# Patient Record
Sex: Male | Born: 1995 | Hispanic: Yes | Marital: Single
Health system: Southern US, Community
[De-identification: ages and names within clinical notes are randomized; demographics above are authoritative.]

## PROBLEM LIST (undated history)

## (undated) DIAGNOSIS — J45909 Unspecified asthma, uncomplicated: Secondary | ICD-10-CM

---

## 2017-10-11 ENCOUNTER — Encounter (HOSPITAL_COMMUNITY): Payer: Self-pay | Admitting: Emergency Medicine

## 2017-10-11 ENCOUNTER — Emergency Department (HOSPITAL_COMMUNITY): Payer: Self-pay

## 2017-10-11 ENCOUNTER — Emergency Department (HOSPITAL_COMMUNITY)
Admission: EM | Admit: 2017-10-11 | Discharge: 2017-10-11 | Disposition: A | Discharge status: Home / Self Care | Payer: Self-pay | Attending: Emergency Medicine | Admitting: Emergency Medicine

## 2017-10-11 DIAGNOSIS — W230XXA Caught, crushed, jammed, or pinched between moving objects, initial encounter: Secondary | ICD-10-CM | POA: Insufficient documentation

## 2017-10-11 DIAGNOSIS — S61312A Laceration without foreign body of right middle finger with damage to nail, initial encounter: Secondary | ICD-10-CM | POA: Insufficient documentation

## 2017-10-11 DIAGNOSIS — Y93B3 Activity, free weights: Secondary | ICD-10-CM | POA: Insufficient documentation

## 2017-10-11 DIAGNOSIS — S67192A Crushing injury of right middle finger, initial encounter: Secondary | ICD-10-CM | POA: Insufficient documentation

## 2017-10-11 DIAGNOSIS — Y999 Unspecified external cause status: Secondary | ICD-10-CM | POA: Insufficient documentation

## 2017-10-11 DIAGNOSIS — Y929 Unspecified place or not applicable: Secondary | ICD-10-CM | POA: Insufficient documentation

## 2017-10-11 DIAGNOSIS — Z23 Encounter for immunization: Secondary | ICD-10-CM | POA: Insufficient documentation

## 2017-10-11 DIAGNOSIS — J45909 Unspecified asthma, uncomplicated: Secondary | ICD-10-CM | POA: Insufficient documentation

## 2017-10-11 HISTORY — DX: Unspecified asthma, uncomplicated: J45.909

## 2017-10-11 MED ORDER — LORAZEPAM 2 MG/ML IJ SOLN: 1.0000 mg | Freq: Once | INTRAMUSCULAR | Status: DC

## 2017-10-11 MED ORDER — LIDOCAINE HCL (PF) 1 % IJ SOLN
30.0000 mL | Freq: Once | INTRAMUSCULAR | Status: AC
  Administered 2017-10-11: 30 mL via INTRADERMAL
  Filled 2017-10-11: qty 30

## 2017-10-11 MED ORDER — TETANUS-DIPHTH-ACELL PERTUSSIS 5-2.5-18.5 LF-MCG/0.5 IM SUSP
0.5000 mL | Freq: Once | INTRAMUSCULAR | Status: AC
  Administered 2017-10-11: 0.5 mL via INTRAMUSCULAR
  Filled 2017-10-11: qty 0.5

## 2017-10-11 MED ORDER — CEPHALEXIN 500 MG PO CAPS: 500.0000 mg | ORAL_CAPSULE | Freq: Two times a day (BID) | ORAL | 0 refills | Status: AC

## 2017-10-11 MED ORDER — HYDROCODONE-ACETAMINOPHEN 5-325 MG PO TABS: 2.0000 | ORAL_TABLET | Freq: Four times a day (QID) | ORAL | 0 refills | Status: AC | PRN

## 2017-10-11 MED ORDER — CEPHALEXIN 250 MG PO CAPS
500.0000 mg | ORAL_CAPSULE | Freq: Once | ORAL | Status: AC
  Administered 2017-10-11: 500 mg via ORAL
  Filled 2017-10-11: qty 2

## 2017-10-11 MED ORDER — MORPHINE SULFATE (PF) 4 MG/ML IV SOLN
4.0000 mg | Freq: Once | INTRAVENOUS | Status: AC
  Administered 2017-10-11: 4 mg via INTRAMUSCULAR
  Filled 2017-10-11: qty 1

## 2017-10-11 MED ORDER — IBUPROFEN 800 MG PO TABS: 800.0000 mg | ORAL_TABLET | Freq: Three times a day (TID) | ORAL | 0 refills | Status: AC | PRN

## 2017-10-11 NOTE — ED Provider Notes (Signed)
TIME SEEN: 3:25 AM  CHIEF COMPLAINT: Finger laceration  HPI: Patient is a 22 year old right-hand-dominant male with history of asthma who presents to the emergency department with right middle finger laceration.  States he was lifting weights when he dropped a 50 pound weight on his right third digit.  No other injury.  Has decreased sensation at the tip of his finger.  Last tetanus vaccination 2013.  Full range of motion in the hand.  ROS: See HPI Constitutional: no fever  Eyes: no drainage  ENT: no runny nose   Cardiovascular:  no chest pain  Resp: no SOB  GI: no vomiting GU: no dysuria Integumentary: no rash  Allergy: no hives  Musculoskeletal: no leg swelling  Neurological: no slurred speech ROS otherwise negative  PAST MEDICAL HISTORY/PAST SURGICAL HISTORY:  Past Medical History:  Diagnosis Date  . Asthma     MEDICATIONS:  Prior to Admission medications   Not on File    ALLERGIES:  Not on File  SOCIAL HISTORY:  Social History   Tobacco Use  . Smoking status: Not on file  Substance Use Topics  . Alcohol use: Not on file    FAMILY HISTORY: No family history on file.  EXAM: BP (!) 155/66 (BP Location: Left Arm)   Pulse 100   Temp 98.6 F (37 C) (Oral)   Resp 16   Ht 5\' 10"  (1.778 m)   Wt 81.6 kg   SpO2 100%   BMI 25.83 kg/m  CONSTITUTIONAL: Alert and oriented and responds appropriately to questions. Well-appearing; well-nourished patient is extremely anxious HEAD: Normocephalic EYES: Conjunctivae clear, pupils appear equal, EOMI ENT: normal nose; moist mucous membranes NECK: Supple, no meningismus, no nuchal rigidity, no LAD  CARD: RRR; S1 and S2 appreciated; no murmurs, no clicks, no rubs, no gallops RESP: Normal chest excursion without splinting or tachypnea; breath sounds clear and equal bilaterally; no wheezes, no rhonchi, no rales, no hypoxia or respiratory distress, speaking full sentences ABD/GI: Normal bowel sounds; non-distended; soft,  non-tender, no rebound, no guarding, no peritoneal signs, no hepatosplenomegaly BACK:  The back appears normal and is non-tender to palpation, there is no CVA tenderness EXT: Laceration to the tip of the third right digit.  The nail appears to be coming off but is intact and there is no obvious nailbed injury.  No obvious bony deformity noted.  Full range of motion in all digits of the right hand other than some diminished flexion and extension of the right third digit secondary to swelling.  Normal ROM in all joints; otherwise extremities are non-tender to palpation; no edema; normal capillary refill; no cyanosis, no calf tenderness or swelling, 2+ right radial pulse    SKIN: Normal color for age and race; warm; no rash NEURO: Moves all extremities equally patient throughout the right arm except decreased sensation of the right finger tip of the third right digit PSYCH: The patient's mood and manner are appropriate. Grooming and personal hygiene are appropriate.  MEDICAL DECISION MAKING: Patient here with finger laceration.   X-ray shows no osseous abnormality.  Will update tetanus vaccination and given extent of laceration I will put him on prophylactic Keflex.  ED PROGRESS: Patient's finger laceration was repaired using 7 simple interrupted sutures.  I did pull back the nail that did reveal macerated laceration that was not amenable to suture repair to the proximal part of the nailbed.  The nail was reattached and patient was placed in a sterile dressing and splint.  They are planning  to move to [place] next week.  Have recommended outpatient hand surgery follow-up even though there is no fracture given this is a significant crush injury.  No obvious tendon injury on examination but again have recommended close follow-up given there is some limitation to flexion and extension which is likely secondary to swelling which is limiting the examination.  Wound care instructions discussed with patient and  family.  They are comfortable with this plan.  LACERATION REPAIR Performed by: Rochele Raring Authorized by: Rochele Raring Consent: Verbal consent obtained. Risks and benefits: risks, benefits and alternatives were discussed Consent given by: patient Patient identity confirmed: provided demographic data Prepped and Draped in normal sterile fashion Wound explored  Laceration Location: Right third finger  Laceration Length: 4 cm  No Foreign Bodies seen or palpated  Anesthesia: local infiltration  Local anesthetic: lidocaine 1 % without epinephrine  Anesthetic total: 10 ml  Irrigation method: syringe Amount of cleaning: standard  Skin closure: Superficial  Number of sutures: 7  Technique: Area anesthetized using lidocaine 1% without epinephrine. Wound irrigated copiously with sterile saline. Wound then cleaned with Betadine and draped in sterile fashion. Wound closed using 7 simple interrupted sutures with 4-0 Prolene.  Bacitracin and sterile dressing applied. Good wound approximation and hemostasis achieved.   Patient tolerance: Patient tolerated the procedure well with no immediate complications.  NERVE BLOCK Performed by: Baxter Hire Palmina Clodfelter Consent: Verbal consent obtained. Required items: required blood products, implants, devices, and special equipment available Time out: Immediately prior to procedure a "time out" was called to verify the correct patient, procedure, equipment, support staff and site/side marked as required.  Indication: Laceration repair Nerve block body site: Right third digit  Preparation: Patient was prepped and draped in the usual sterile fashion. Needle gauge: 24 G Location technique: anatomical landmarks  Local anesthetic: 1% lidocaine without epinephrine  Anesthetic total: 10 ml  Outcome: pain improved Patient tolerance: Patient tolerated the procedure well with no immediate complications.   SPLINT APPLICATION Date/Time: 5:04 AM Authorized  by: Baxter Hire Kj Imbert Consent: Verbal consent obtained. Risks and benefits: risks, benefits and alternatives were discussed Consent given by: patient Splint applied by: nurse Location details: Right third finger Splint type: Aluminum splint Supplies used: Aluminum splint Post-procedure: The splinted body part was neurovascularly unchanged following the procedure. Patient tolerance: Patient tolerated the procedure well with no immediate complications.       Lashante Fryberger, Layla Maw, DO 10/11/17 [phone]

## 2017-10-11 NOTE — ED Triage Notes (Signed)
Patient was lifting weights and dropped on R hand middle finger. Bleeding controlled

## 2020-01-19 IMAGING — DX DG FINGER MIDDLE 2+V*R*
3 series · 3 of 3 positions shown · non-contrast
Comparison: None

CLINICAL DATA: Laceration to RIGHT middle finger

EXAM:
RIGHT MIDDLE FINGER 2+V

[finger ap]
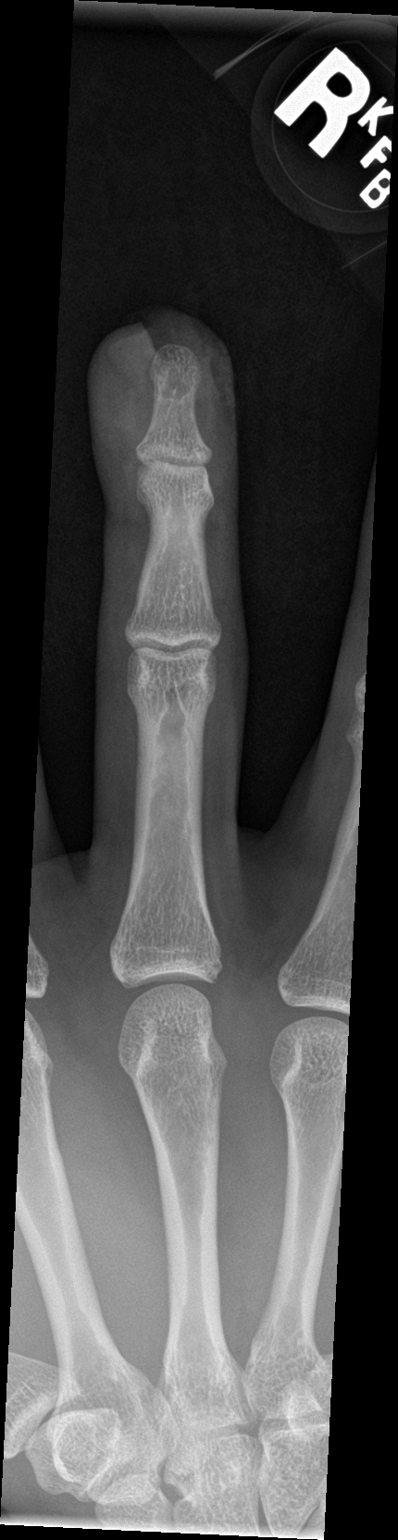

[finger obl]
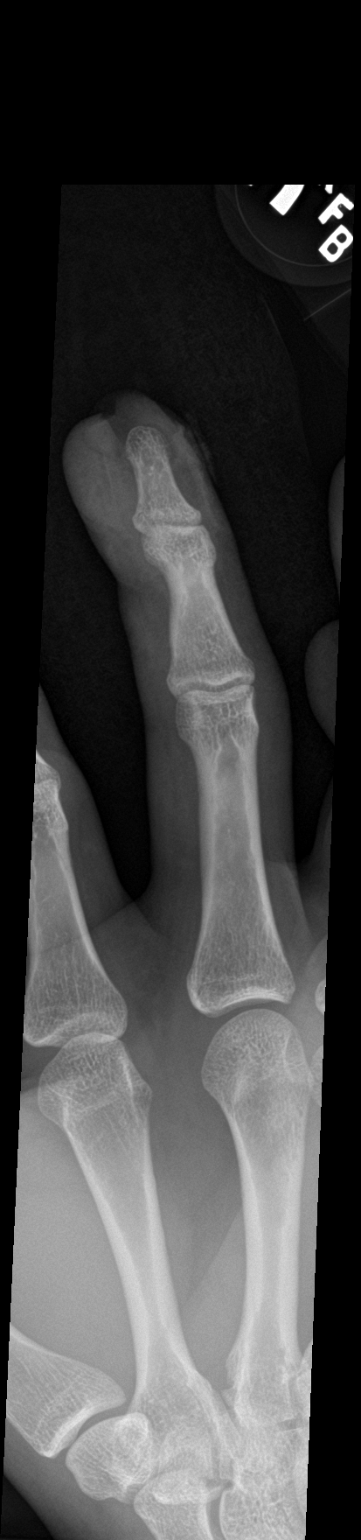

[finger lat]
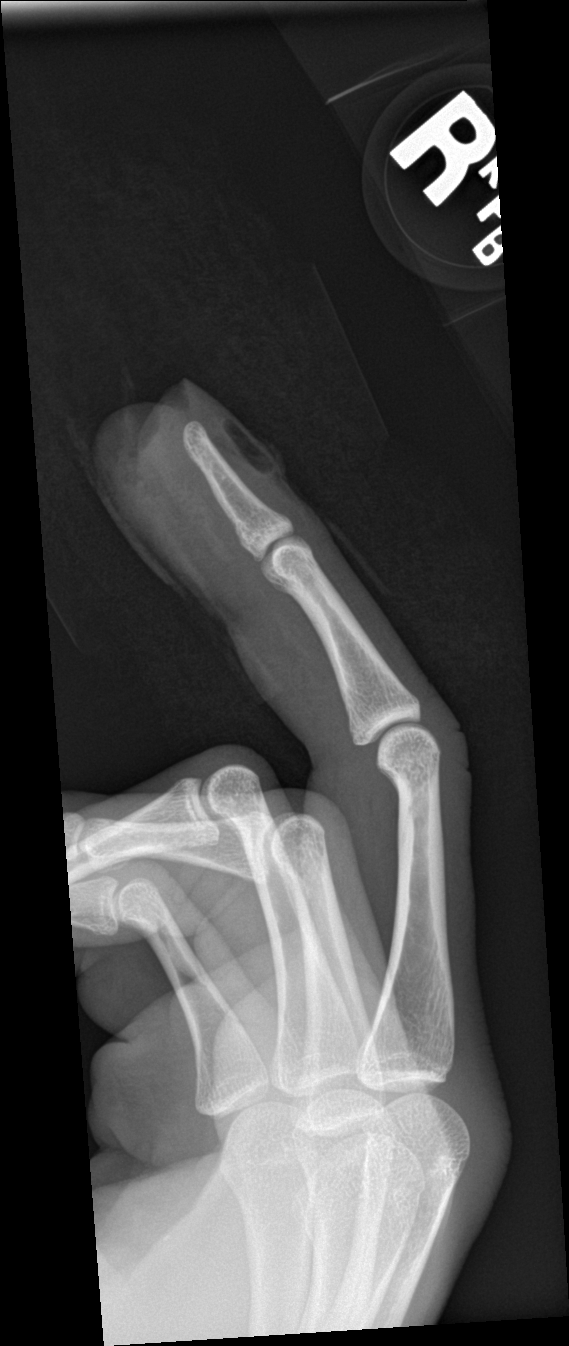

[3 of 3 positions shown; findings below may reference images not displayed]

FINDINGS: Soft tissue deformity and gas at distal phalanx RIGHT middle finger.

Osseous mineralization normal.

Joint spaces preserved.

No acute fracture, dislocation, or bone destruction.
IMPRESSION: No acute osseous abnormalities.
